# Patient Record
Sex: Male | Born: 1975 | Race: Black or African American | Hispanic: No | Marital: Married | Smoking: Never smoker
Health system: Southern US, Community
[De-identification: ages and names within clinical notes are randomized; demographics above are authoritative.]

---

## 2016-04-06 ENCOUNTER — Encounter (HOSPITAL_COMMUNITY): Payer: Self-pay

## 2016-04-06 ENCOUNTER — Ambulatory Visit (HOSPITAL_COMMUNITY)
Admission: EM | Admit: 2016-04-06 | Discharge: 2016-04-06 | Disposition: A | Discharge status: Home / Self Care | Payer: Self-pay | Attending: Emergency Medicine | Admitting: Emergency Medicine

## 2016-04-06 DIAGNOSIS — J111 Influenza due to unidentified influenza virus with other respiratory manifestations: Secondary | ICD-10-CM

## 2016-04-06 MED ORDER — IBUPROFEN 800 MG PO TABS: 800.0000 mg | ORAL_TABLET | Freq: Three times a day (TID) | ORAL | 0 refills | Status: DC

## 2016-04-06 MED ORDER — AEROCHAMBER PLUS MISC: 2 refills | Status: DC

## 2016-04-06 MED ORDER — ALBUTEROL SULFATE HFA 108 (90 BASE) MCG/ACT IN AERS: 1.0000 | INHALATION_SPRAY | Freq: Four times a day (QID) | RESPIRATORY_TRACT | 0 refills | Status: DC | PRN

## 2016-04-06 MED ORDER — DOXYCYCLINE HYCLATE 100 MG PO CAPS: 100.0000 mg | ORAL_CAPSULE | Freq: Two times a day (BID) | ORAL | 0 refills | Status: DC

## 2016-04-06 MED ORDER — GUAIFENESIN-CODEINE 100-10 MG/5ML PO SYRP: 10.0000 mL | ORAL_SOLUTION | Freq: Four times a day (QID) | ORAL | 0 refills | Status: DC | PRN

## 2016-04-06 NOTE — Discharge Instructions (Signed)
Cheratussin, albuterol with a spacer. Do this one to 2 puffs every 4-6 hours a needed for coughing, wheezing, shortness of breath. ibuprofen 800 mg to take with 1 g of Tylenol 3 times a day. Push fluids. I think that you either have a sinus infection or pneumonia from from the flu. Follow-up with a primary care physician of her choice. She was blow  Below is a list of primary care practices who are taking new patients for you to follow-up with. [facility] [address] [place], [place] [address] [phone]  [facility] [phone] [address] [place] [address]  [facility]: [address] [place] [id number]  [phone]  [facility]: [address] [place] [id number]   [phone]  [facility]: [address] [place] [address]  [phone]  [facility] : [address] [place] [address] [phone]  [facility]: [address] [place] [phone]  [facility]: [address] [place] [id number] [phone]  Dr. Oneal GroutMahima Pandey [address] [facility] [place] [id number]  [phone]  Dr. Jackie PlumGeorge Osei-Bonsu, [facility]. [address] [place], [place] [address]  [phone]

## 2016-04-06 NOTE — ED Triage Notes (Signed)
Pt said he thinks he has the flu. Fever of 101 a few days ago, body aches, cold chills and sweating. Cough. Was taking Theraflu .

## 2016-04-06 NOTE — ED Provider Notes (Signed)
HPI  SUBJECTIVE:  Louis Bush is a 41 y.o. male who presents with flulike symptoms for the past week. He reports nasal congestion, rhinorrhea, bodyaches, headaches, fevers Tmax 101. Reports ear fullness, but no ear pain. He reports a sore throat, but this has resolved. He reports purulent nasal drainage and sinus pain/pressure. He has a cough productive of the same material as his nasal congestion with some wheezing. No dental pain, facial swelling. No chest pain, short of breath, abdominal pain, urinary complaints. No rash, neck stiffness, photophobia. No skin infection. He drink there are flu once with improvement in symptoms. He has not taken any medicine in a week. There are no aggravating factors. He states that he is able to sleep at night. No double sickening. He reports 4 episodes of watery, nonbloody diarrhea starting today. Normally stools once daily. His past medical history of anemia. No history of asthma, emphysema, COPD. He is not a smoker. No history of diabetes, hypertension, pneumonias, UTI, immunocompromise or cancer. PMD: None.    History reviewed. No pertinent past medical history.  History reviewed. No pertinent surgical history.  No family history on file.  Social History  Substance Use Topics  . Smoking status: Never Smoker  . Smokeless tobacco: Never Used  . Alcohol use 3.6 oz/week    6 Cans of beer per week    No current facility-administered medications for this encounter.   Current Outpatient Prescriptions:  .  albuterol (PROVENTIL HFA;VENTOLIN HFA) 108 (90 Base) MCG/ACT inhaler, Inhale 1-2 puffs into the lungs every 6 (six) hours as needed for wheezing or shortness of breath., Disp: 1 Inhaler, Rfl: 0 .  doxycycline (VIBRAMYCIN) 100 MG capsule, Take 1 capsule (100 mg total) by mouth 2 (two) times daily. X 10 days, Disp: 20 capsule, Rfl: 0 .  guaiFENesin-codeine (CHERATUSSIN AC) 100-10 MG/5ML syrup, Take 10 mLs by mouth 4 (four) times daily as needed for cough.,  Disp: 120 mL, Rfl: 0 .  ibuprofen (ADVIL,MOTRIN) 800 MG tablet, Take 1 tablet (800 mg total) by mouth 3 (three) times daily., Disp: 30 tablet, Rfl: 0 .  Spacer/Aero-Holding Chambers (AEROCHAMBER PLUS) inhaler, Use as instructed, Disp: 1 each, Rfl: 2  No Known Allergies   ROS  As noted in HPI.   Physical Exam  BP 129/86 (BP Location: Left Arm)   Pulse 98   Temp 100.5 F (38.1 C) (Oral)   Resp 20   SpO2 97%   Constitutional: Well developed, well nourished, no acute distress Eyes: PERRL, EOMI, conjunctiva normal bilaterally HENT: Normocephalic, atraumatic,mucus membranes moist. Mild nasal congestion, normal turbinates. Positive mild maxillary sinus tenderness. Oropharynx normal. Neck: No cervical lymphadenopathy, no meningismus Respiratory: Clear to auscultation bilaterally, no rales, no wheezing, no rhonchi Cardiovascular: Normal rate and rhythm, no murmurs, no gallops, no rubs GI: Soft, nondistended, normal bowel sounds, nontender, no rebound, no guarding Back: no CVAT skin: No rash, skin intact Musculoskeletal: No edema, no tenderness, no deformities Neurologic: Alert & oriented x 3, CN II-XII grossly intact, no motor deficits, sensation grossly intact Psychiatric: Speech and behavior appropriate   ED Course   Medications - No data to display  No orders of the defined types were placed in this encounter.  No results found for this or any previous visit (from the past 24 hour(s)). No results found.  ED Clinical Impression  Influenza  ED Assessment/Plan  Patient febrile, but has not taken antipyretic in the past 6-8 hours. He is satting well room air. He does have some mild sinus  tenderness, but has no real nasal congestion and postnasal drip. Plan to send home with doxycycline x 10 days as I suspect that he has a secondary sinusitis or pneumonia from the flu that he had earlier this week. Does not appear to be meningitis, otitis, UTI or other intra-abdominal  process.We'll also give him some Cheratussin, albuterol with a spacer, ibuprofen 800 mg for him to take with 1 g of Tylenol 3 times a day. Providing primary care referral. Deferring chest x-ray today as it would not change management. We'll consider doing a chest x-ray see does not get better with the doxycycline.   Discussed  MDM, plan and followup with patient. Discussed sn/sx that should prompt return to the ED. Patient agrees with plan.   Meds ordered this encounter  Medications  . doxycycline (VIBRAMYCIN) 100 MG capsule    Sig: Take 1 capsule (100 mg total) by mouth 2 (two) times daily. X 10 days    Dispense:  20 capsule    Refill:  0  . ibuprofen (ADVIL,MOTRIN) 800 MG tablet    Sig: Take 1 tablet (800 mg total) by mouth 3 (three) times daily.    Dispense:  30 tablet    Refill:  0  . albuterol (PROVENTIL HFA;VENTOLIN HFA) 108 (90 Base) MCG/ACT inhaler    Sig: Inhale 1-2 puffs into the lungs every 6 (six) hours as needed for wheezing or shortness of breath.    Dispense:  1 Inhaler    Refill:  0  . Spacer/Aero-Holding Chambers (AEROCHAMBER PLUS) inhaler    Sig: Use as instructed    Dispense:  1 each    Refill:  2  . guaiFENesin-codeine (CHERATUSSIN AC) 100-10 MG/5ML syrup    Sig: Take 10 mLs by mouth 4 (four) times daily as needed for cough.    Dispense:  120 mL    Refill:  0    *This clinic note was created using Scientist, clinical (histocompatibility and immunogenetics)Dragon dictation software. Therefore, there may be occasional mistakes despite careful proofreading.  ?   Domenick GongAshley Patrick Salemi, MD 04/06/16 2138

## 2016-12-01 ENCOUNTER — Ambulatory Visit (HOSPITAL_COMMUNITY)
Admission: EM | Admit: 2016-12-01 | Discharge: 2016-12-01 | Disposition: A | Discharge status: Home / Self Care | Payer: Self-pay | Attending: Family Medicine | Admitting: Family Medicine

## 2016-12-01 ENCOUNTER — Encounter (HOSPITAL_COMMUNITY): Payer: Self-pay | Admitting: *Deleted

## 2016-12-01 DIAGNOSIS — T148XXA Other injury of unspecified body region, initial encounter: Secondary | ICD-10-CM

## 2016-12-01 LAB — POCT URINALYSIS DIP (DEVICE)
Bilirubin Urine: NEGATIVE
Glucose, UA: NEGATIVE mg/dL
Hgb urine dipstick: NEGATIVE
KETONES UR: NEGATIVE mg/dL
Leukocytes, UA: NEGATIVE
Nitrite: NEGATIVE
PH: 5.5 (ref 5.0–8.0)
PROTEIN: NEGATIVE mg/dL
Specific Gravity, Urine: >=1.03 (ref 1.005–1.030)
Urobilinogen, UA: 0.2 mg/dL (ref 0.0–1.0)

## 2016-12-01 MED ORDER — IBUPROFEN 800 MG PO TABS: 800.0000 mg | ORAL_TABLET | Freq: Three times a day (TID) | ORAL | 0 refills | Status: DC

## 2016-12-01 MED ORDER — CYCLOBENZAPRINE HCL 10 MG PO TABS: 10.0000 mg | ORAL_TABLET | Freq: Three times a day (TID) | ORAL | 0 refills | Status: DC | PRN

## 2016-12-01 NOTE — ED Triage Notes (Addendum)
C/O right low back pain x couple wks intermittently, much worse today.  States he feels like his urine looked different today.

## 2016-12-01 NOTE — ED Provider Notes (Addendum)
[facility]    CSN: [id number] Arrival date & time: 12/01/16  1836     History   Chief Complaint Chief Complaint  Patient presents with  . Back Pain    HPI Louis Bush is a 41 y.o. male.   Presents today for right low back pain for several week but noticed to be worse today. Pain radiates to the mid low back. Pain is sharp. Pain is constant. No dysuria but noticed possible blood in urine today; states the urine looked funny. Is sexually active with 1 partner. Denies hx of kidney stone. Patient have not tried anything for the pain.       History reviewed. No pertinent past medical history.  There are no active problems to display for this patient.   History reviewed. No pertinent surgical history.     Home Medications    Prior to Admission medications   Medication Sig Start Date End Date Taking? Authorizing Provider  albuterol (PROVENTIL HFA;VENTOLIN HFA) 108 (90 Base) MCG/ACT inhaler Inhale 1-2 puffs into the lungs every 6 (six) hours as needed for wheezing or shortness of breath. 04/06/16   Domenick Gong, MD  cyclobenzaprine (FLEXERIL) 10 MG tablet Take 1 tablet (10 mg total) by mouth 3 (three) times daily as needed for muscle spasms. 12/01/16   Lucia Estelle, NP  doxycycline (VIBRAMYCIN) 100 MG capsule Take 1 capsule (100 mg total) by mouth 2 (two) times daily. X 10 days 04/06/16   Domenick Gong, MD  guaiFENesin-codeine [facility]) 100-10 MG/5ML syrup Take 10 mLs by mouth 4 (four) times daily as needed for cough. 04/06/16   Domenick Gong, MD  ibuprofen (ADVIL,MOTRIN) 800 MG tablet Take 1 tablet (800 mg total) by mouth 3 (three) times daily. 12/01/16   Lucia Estelle, NP  Spacer/Aero-Holding Chambers (AEROCHAMBER PLUS) inhaler Use as instructed 04/06/16   Domenick Gong, MD    Family History No family history on file.  Social History Social History  Substance Use Topics  . Smoking status: Never Smoker  . Smokeless tobacco: Never Used  . Alcohol  use Yes     Comment: occasionally     Allergies   Bee venom   Review of Systems Review of Systems  Constitutional:       See HPI     Physical Exam Triage Vital Signs ED Triage Vitals  Enc Vitals Group     BP 12/01/16 1955 135/88     Pulse Rate 12/01/16 1955 72     Resp 12/01/16 1955 16     Temp 12/01/16 1955 98 F (36.7 C)     Temp Source 12/01/16 1955 Oral     SpO2 12/01/16 1955 99 %     Weight --      Height --      Head Circumference --      Peak Flow --      Pain Score 12/01/16 1956 8     Pain Loc --      Pain Edu? --      Excl. in GC? --    No data found.   Updated Vital Signs BP 135/88   Pulse 72   Temp 98 F (36.7 C) (Oral)   Resp 16   SpO2 99%   Visual Acuity Right Eye Distance:   Left Eye Distance:   Bilateral Distance:    Right Eye Near:   Left Eye Near:    Bilateral Near:     Physical Exam  Constitutional: He is oriented to  person, place, and time. He appears well-developed and well-nourished.  Cardiovascular: Normal rate, regular rhythm and normal heart sounds.   No murmur heard. Pulmonary/Chest: Effort normal and breath sounds normal. He has no wheezes.  Abdominal: Soft. Bowel sounds are normal. There is no tenderness.  Musculoskeletal:       Back:  Tender to palpate over right low back and mid low back. Ambulatory, gait normal. Has full spine ROM.   Neurological: He is alert and oriented to person, place, and time.  Skin: Skin is warm and dry.  Nursing note and vitals reviewed.    UC Treatments / Results  Labs (all labs ordered are listed, but only abnormal results are displayed) Labs Reviewed  POCT URINALYSIS DIP (DEVICE)    EKG  EKG Interpretation None       Radiology No results found.  Procedures Procedures (including critical care time)  Medications Ordered in UC Medications - No data to display   Initial Impression / Assessment and Plan / UC Course  I have reviewed the triage vital signs and the  nursing notes.  Pertinent labs & imaging results that were available during my care of the patient were reviewed by me and considered in my medical decision making (see chart for details).    Final Clinical Impressions(s) / UC Diagnoses   Final diagnoses:  Muscle strain   UA unremarkable. Kidney stone very low in differential. Clinical presentation most consistent with muscle strain. Will tx with NSAID and muscle relaxer. RX send in.  Apply heat to the area.  Advised to f/u with PCP for no improvement.   New Prescriptions New Prescriptions   CYCLOBENZAPRINE (FLEXERIL) 10 MG TABLET    Take 1 tablet (10 mg total) by mouth 3 (three) times daily as needed for muscle spasms.   IBUPROFEN (ADVIL,MOTRIN) 800 MG TABLET    Take 1 tablet (800 mg total) by mouth 3 (three) times daily.    Controlled Substance Prescriptions [place] Controlled Substance Registry consulted? Yes     Lucia EstelleZheng, Gracey Tolle, NP 12/01/16 2016

## 2017-04-13 ENCOUNTER — Ambulatory Visit (HOSPITAL_COMMUNITY)
Admission: EM | Admit: 2017-04-13 | Discharge: 2017-04-13 | Disposition: A | Discharge status: Home / Self Care | Payer: Self-pay | Attending: Family Medicine | Admitting: Family Medicine

## 2017-04-13 ENCOUNTER — Encounter (HOSPITAL_COMMUNITY): Payer: Self-pay | Admitting: *Deleted

## 2017-04-13 ENCOUNTER — Other Ambulatory Visit: Payer: Self-pay

## 2017-04-13 DIAGNOSIS — M79662 Pain in left lower leg: Secondary | ICD-10-CM

## 2017-04-13 DIAGNOSIS — S86112A Strain of other muscle(s) and tendon(s) of posterior muscle group at lower leg level, left leg, initial encounter: Secondary | ICD-10-CM

## 2017-04-13 MED ORDER — HYDROCODONE-ACETAMINOPHEN 5-325 MG PO TABS: 1.0000 | ORAL_TABLET | Freq: Four times a day (QID) | ORAL | 0 refills | Status: DC | PRN

## 2017-04-13 MED ORDER — DICLOFENAC SODIUM 75 MG PO TBEC: 75.0000 mg | DELAYED_RELEASE_TABLET | Freq: Two times a day (BID) | ORAL | 0 refills | Status: DC

## 2017-04-13 NOTE — ED Notes (Addendum)
The crutches we have do not fit the patient as he is 6'7 and over 300 lbs. Paged ortho tech and they state they also do not have any. Per Dr. Tracie HarrierHagler we can just let him go to the pharmacy and ask if they can special order some. Also he should try to be non weight bearing at home over the next few days as per his work note.

## 2017-04-13 NOTE — Discharge Instructions (Addendum)
Use crutches to avoid putting weight on your left leg. You will need to follow up in one week with an orthopaedist. You may ask your pharmacy if they have a calf compression sleeve to wear.  Be aware, pain medications may cause drowsiness. Please do not drive, operate heavy machinery or make important decisions while on this medication, it can cloud your judgement.

## 2017-04-13 NOTE — ED Triage Notes (Signed)
Per pt his left shin is swollen, per pt this started yesterday,

## 2017-04-17 NOTE — ED Provider Notes (Signed)
[facility]   [id number] 04/13/17 Arrival Time: 1557  ASSESSMENT & PLAN:  1. Gastrocnemius tear, left, initial encounter    Meds ordered this encounter  Medications  . diclofenac (VOLTAREN) 75 MG EC tablet    Sig: Take 1 tablet (75 mg total) by mouth 2 (two) times daily.    Dispense:  14 tablet    Refill:  0  . HYDROcodone-acetaminophen (NORCO/VICODIN) 5-325 MG tablet    Sig: Take 1 tablet by mouth every 6 (six) hours as needed for moderate pain or severe pain.    Dispense:  8 tablet    Refill:  0   We do not have crutches for his height. He will acquire from medical supply. May be WBAT but crutches may help with discomfort.  Recommend that he schedule prompt f/u with an orthopaedist; information given. May f/u here as neededn  Reviewed expectations re: course of current medical issues. Questions answered. Outlined signs and symptoms indicating need for more acute intervention. Patient verbalized understanding. After Visit Summary given.  SUBJECTIVE: History from: patient. Louis Bush is a 42 y.o. male who reports:  Pain/Discomfort of: left calf Onset abrupt, yesterday Frequency: intermittent discomfort Injury/trama: reports shifting weight on legs very quickly because he almost slipped yesterday; immediate "kind of a popping sound" with discomfort over lower calf; no direct trauma to calf Describes as: localized aching without radiation Severity: mild Progression: stable Relieved by: not bearing weight Worsened by: movement and weight bearing Associated symptoms: mild swelling Extremity sensation changes or weakness: none Self treatment: has not tried OTCs for relief of pain  History of similar: no  ROS: As per HPI.   OBJECTIVE:  Vitals:   04/13/17 1732  BP: 134/74  Pulse: 75  Temp: 98.7 F (37.1 C)  TempSrc: Oral  SpO2: 100%    General appearance: alert; no distress Extremities: no cyanosis or edema; symmetrical with no gross deformities;  localized tenderness over his left calf with mild swelling and no bruising; no bunching of muscle but he is quite tender over the distal gastrocnemius ROM: normal CV: normal extremity capillary refill Skin: warm and dry Neurologic: normal gait; normal symmetric reflexes in all extremities; normal sensation in all extremities Psychological: alert and cooperative; normal mood and affect  Allergies  Allergen Reactions  . Bee Venom      Social History   Socioeconomic History  . Marital status: Married    Spouse name: Not on file  . Number of children: Not on file  . Years of education: Not on file  . Highest education level: Not on file  Social Needs  . Financial resource strain: Not on file  . Food insecurity - worry: Not on file  . Food insecurity - inability: Not on file  . Transportation needs - medical: Not on file  . Transportation needs - non-medical: Not on file  Occupational History  . Not on file  Tobacco Use  . Smoking status: Never Smoker  . Smokeless tobacco: Never Used  Substance and Sexual Activity  . Alcohol use: Yes    Comment: occasionally  . Drug use: No  . Sexual activity: Not on file  Other Topics Concern  . Not on file  Social History Narrative  . Not on file   Family History  Problem Relation Age of Onset  . Prostate cancer Father   . Heart attack Father        had open heart Surgery    History reviewed. No pertinent surgical history.  Mardella LaymanHagler, Hanson Medeiros, MD 04/17/17 [phone]

## 2017-11-05 ENCOUNTER — Emergency Department (HOSPITAL_COMMUNITY)
Admission: EM | Admit: 2017-11-05 | Discharge: 2017-11-06 | Disposition: A | Discharge status: Home / Self Care | Payer: Self-pay | Attending: Emergency Medicine | Admitting: Emergency Medicine

## 2017-11-05 ENCOUNTER — Emergency Department (HOSPITAL_COMMUNITY): Payer: Self-pay

## 2017-11-05 ENCOUNTER — Other Ambulatory Visit: Payer: Self-pay

## 2017-11-05 ENCOUNTER — Encounter (HOSPITAL_COMMUNITY): Payer: Self-pay | Admitting: Emergency Medicine

## 2017-11-05 DIAGNOSIS — R079 Chest pain, unspecified: Secondary | ICD-10-CM

## 2017-11-05 DIAGNOSIS — Z79899 Other long term (current) drug therapy: Secondary | ICD-10-CM | POA: Insufficient documentation

## 2017-11-05 DIAGNOSIS — R0789 Other chest pain: Secondary | ICD-10-CM | POA: Insufficient documentation

## 2017-11-05 LAB — BASIC METABOLIC PANEL
ANION GAP: 11 (ref 5–15)
BUN: 13 mg/dL (ref 6–20)
CALCIUM: 9.7 mg/dL (ref 8.9–10.3)
CO2: 22 mmol/L (ref 22–32)
Chloride: 106 mmol/L (ref 98–111)
Creatinine, Ser: 1.34 mg/dL — ABNORMAL HIGH (ref 0.61–1.24)
Glucose, Bld: 107 mg/dL — ABNORMAL HIGH (ref 70–99)
POTASSIUM: 4.2 mmol/L (ref 3.5–5.1)
Sodium: 139 mmol/L (ref 135–145)

## 2017-11-05 LAB — CBC
HEMATOCRIT: 41.4 % (ref 39.0–52.0)
HEMOGLOBIN: 13.4 g/dL (ref 13.0–17.0)
MCH: 25.4 pg — ABNORMAL LOW (ref 26.0–34.0)
MCHC: 32.4 g/dL (ref 30.0–36.0)
MCV: 78.6 fL (ref 78.0–100.0)
Platelets: 262 10*3/uL (ref 150–400)
RBC: 5.27 MIL/uL (ref 4.22–5.81)
RDW: 18 % — ABNORMAL HIGH (ref 11.5–15.5)
WBC: 8.8 10*3/uL (ref 4.0–10.5)

## 2017-11-05 LAB — I-STAT TROPONIN, ED: TROPONIN I, POC: 0.01 ng/mL (ref 0.00–0.08)

## 2017-11-05 NOTE — ED Triage Notes (Signed)
Pt st's he started having left sided sharp chest pain tonight while at rest.  Pain increases with deep breathing.

## 2017-11-06 LAB — I-STAT TROPONIN, ED: TROPONIN I, POC: 0.01 ng/mL (ref 0.00–0.08)

## 2017-11-06 NOTE — Discharge Instructions (Addendum)
Follow-up with cardiology to discuss a possible stress test.  [facility] information [facility] has been provided in this discharge summary for you to call and make these arrangements.  Return to the emergency department if your symptoms significantly worsen or change.

## 2017-11-06 NOTE — ED Provider Notes (Signed)
[facility]   CSN: [id number] Arrival date & time: 11/05/17  2131     History   Chief Complaint Chief Complaint  Patient presents with  . Chest Pain    HPI Viann Shoveroy Sermersheim is a 42 y.o. male.  Patient is a 42 year old male with past medical history of asthma.  He presents today for evaluation of chest discomfort.  This started approximately 9:00 this evening while he was laying in bed watching TV.  He reports some arm numbness and feeling sweaty, but denied shortness of breath or nausea.  He denies any recent exertional symptoms.  He denies any prior cardiac history.  Cardiac risk factors include obesity and family history with his father having heart issues in his 3350s.  The history is provided by the patient.  Chest Pain   This is a new problem. The current episode started 1 to 2 hours ago. The problem occurs constantly. The problem has been gradually improving. The pain is present in the lateral region. The pain is moderate. The quality of the pain is described as pressure-like. The pain radiates to the left arm. Pertinent negatives include no diaphoresis, no nausea and no shortness of breath. He has tried nothing for the symptoms. Risk factors include obesity.    History reviewed. No pertinent past medical history.  There are no active problems to display for this patient.   History reviewed. No pertinent surgical history.      Home Medications    Prior to Admission medications   Medication Sig Start Date End Date Taking? Authorizing Provider  albuterol (PROVENTIL HFA;VENTOLIN HFA) 108 (90 Base) MCG/ACT inhaler Inhale 1-2 puffs into the lungs every 6 (six) hours as needed for wheezing or shortness of breath. 04/06/16   Domenick GongMortenson, Ashley, MD  cyclobenzaprine (FLEXERIL) 10 MG tablet Take 1 tablet (10 mg total) by mouth 3 (three) times daily as needed for muscle spasms. 12/01/16   Lucia EstelleZheng, Feng, NP  diclofenac (VOLTAREN) 75 MG EC  tablet Take 1 tablet (75 mg total) by mouth 2 (two) times daily. 04/13/17   Mardella LaymanHagler, Brian, MD  doxycycline (VIBRAMYCIN) 100 MG capsule Take 1 capsule (100 mg total) by mouth 2 (two) times daily. X 10 days 04/06/16   Domenick GongMortenson, Ashley, MD  guaiFENesin-codeine [facility](CHERATUSSIN AC) 100-10 MG/5ML syrup Take 10 mLs by mouth 4 (four) times daily as needed for cough. 04/06/16   Domenick GongMortenson, Ashley, MD  HYDROcodone-acetaminophen (NORCO/VICODIN) 5-325 MG tablet Take 1 tablet by mouth every 6 (six) hours as needed for moderate pain or severe pain. 04/13/17   Mardella LaymanHagler, Brian, MD  ibuprofen (ADVIL,MOTRIN) 800 MG tablet Take 1 tablet (800 mg total) by mouth 3 (three) times daily. 12/01/16   Lucia EstelleZheng, Feng, NP  Spacer/Aero-Holding Chambers (AEROCHAMBER PLUS) inhaler Use as instructed 04/06/16   Domenick GongMortenson, Ashley, MD    Family History Family History  Problem Relation Age of Onset  . Prostate cancer Father   . Heart attack Father        had open heart Surgery     Social History Social History   Tobacco Use  . Smoking status: Never Smoker  . Smokeless tobacco: Never Used  Substance Use Topics  . Alcohol use: Yes    Comment: occasionally  . Drug use: No    Types: Marijuana     Allergies   Bee venom   Review of Systems Review of Systems  Constitutional: Negative for diaphoresis.  Respiratory: Negative for shortness of breath.   Cardiovascular:  Positive for chest pain.  Gastrointestinal: Negative for nausea.  All other systems reviewed and are negative.    Physical Exam Updated Vital Signs BP 117/81   Pulse (!) 49   Temp 98.4 F (36.9 C) (Oral)   Resp 19   Ht 6' 6.5" (1.994 m)   Wt (!) 154.2 kg   SpO2 95%   BMI 38.79 kg/m   Physical Exam  Constitutional: He is oriented to person, place, and time. He appears well-developed and well-nourished. No distress.  HENT:  Head: Normocephalic and atraumatic.  Mouth/Throat: Oropharynx is clear and moist.  Neck: Normal range of motion. Neck supple.    Cardiovascular: Normal rate and regular rhythm. Exam reveals no friction rub.  No murmur heard. Pulmonary/Chest: Effort normal and breath sounds normal. No respiratory distress. He has no wheezes. He has no rales.  Abdominal: Soft. Bowel sounds are normal. He exhibits no distension. There is no tenderness.  Musculoskeletal: Normal range of motion. He exhibits no edema.       Right lower leg: Normal. He exhibits no tenderness and no edema.       Left lower leg: Normal. He exhibits no tenderness and no edema.  Neurological: He is alert and oriented to person, place, and time. Coordination normal.  Skin: Skin is warm and dry. He is not diaphoretic.  Nursing note and vitals reviewed.    ED Treatments / Results  Labs (all labs ordered are listed, but only abnormal results are displayed) Labs Reviewed  BASIC METABOLIC PANEL - Abnormal; Notable for the following components:      Result Value   Glucose, Bld 107 (*)    Creatinine, Ser 1.34 (*)    All other components within normal limits  CBC - Abnormal; Notable for the following components:   MCH 25.4 (*)    RDW 18.0 (*)    All other components within normal limits  I-STAT TROPONIN, ED  I-STAT TROPONIN, ED    EKG EKG Interpretation  Date/Time:  Tuesday November 05 2017 21:37:42 EDT Ventricular Rate:  78 PR Interval:  164 QRS Duration: 88 QT Interval:  376 QTC Calculation: 428 R Axis:   15 Text Interpretation:  Normal sinus rhythm Low voltage QRS Borderline ECG Confirmed by Geoffery LyonseLo, Blakeley Margraf ([id number]) on 11/06/2017 1:11:30 AM   Radiology Dg Chest 2 View  Result Date: 11/05/2017 CLINICAL DATA:  Left-sided chest pain EXAM: CHEST - 2 VIEW COMPARISON:  None. FINDINGS: The heart size and mediastinal contours are within normal limits. Both lungs are clear. The visualized skeletal structures are unremarkable. IMPRESSION: No active cardiopulmonary disease. Electronically Signed   By: Jasmine PangKim  Fujinaga M.D.   On: 11/05/2017 22:14     Procedures Procedures (including critical care time)  Medications Ordered in ED Medications - No data to display   Initial Impression / Assessment and Plan / ED Course  I have reviewed the triage vital signs and the nursing notes.  Pertinent labs & imaging results that were available during my care of the patient were reviewed by me and considered in my medical decision making (see chart for details).  Patient presents here with chest discomfort that started approximately 9 PM while watching TV.  His symptoms are somewhat atypical for cardiac pain in his work-up is unremarkable.  His EKG shows no acute process and troponin x2 is negative.  He is currently symptom-free.  I have discussed the disposition with the patient and wife at bedside.  Patient would like to be discharged and follow-up with  cardiology as an outpatient for possible stress testing.  He understands to return if he worsens.  Final Clinical Impressions(s) / ED Diagnoses   Final diagnoses:  None    ED Discharge Orders    None       Geoffery Lyons, MD 11/06/17 Earle Gell

## 2017-12-05 ENCOUNTER — Encounter (HOSPITAL_COMMUNITY): Payer: Self-pay | Admitting: Emergency Medicine

## 2017-12-05 ENCOUNTER — Emergency Department (HOSPITAL_COMMUNITY)
Admission: EM | Admit: 2017-12-05 | Discharge: 2017-12-05 | Disposition: A | Discharge status: Home / Self Care | Payer: Self-pay | Attending: Emergency Medicine | Admitting: Emergency Medicine

## 2017-12-05 DIAGNOSIS — M5430 Sciatica, unspecified side: Secondary | ICD-10-CM | POA: Insufficient documentation

## 2017-12-05 DIAGNOSIS — Z79899 Other long term (current) drug therapy: Secondary | ICD-10-CM | POA: Insufficient documentation

## 2017-12-05 MED ORDER — HYDROCODONE-ACETAMINOPHEN 5-325 MG PO TABS: 1.0000 | ORAL_TABLET | ORAL | 0 refills | Status: DC | PRN

## 2017-12-05 MED ORDER — DIAZEPAM 5 MG PO TABS: 5.0000 mg | ORAL_TABLET | Freq: Two times a day (BID) | ORAL | 0 refills | Status: DC

## 2017-12-05 MED ORDER — LORAZEPAM 2 MG/ML IJ SOLN
1.0000 mg | Freq: Once | INTRAMUSCULAR | Status: AC
  Administered 2017-12-05: 1 mg via INTRAVENOUS
  Filled 2017-12-05: qty 1

## 2017-12-05 MED ORDER — PREDNISONE 10 MG (21) PO TBPK: ORAL_TABLET | ORAL | 0 refills | Status: DC

## 2017-12-05 MED ORDER — DEXAMETHASONE SODIUM PHOSPHATE 10 MG/ML IJ SOLN
10.0000 mg | Freq: Once | INTRAMUSCULAR | Status: AC
  Administered 2017-12-05: 10 mg via INTRAVENOUS
  Filled 2017-12-05: qty 1

## 2017-12-05 NOTE — ED Provider Notes (Signed)
[facility]-EMERGENCY DEPT Provider Note   CSN: [id number] Arrival date & time: 12/05/17  0908     History   Chief Complaint Chief Complaint  Patient presents with  . Back Pain    HPI Louis Bush is a 42 y.o. male.  Pt presents to the ED today with right sided low back pain.  Pt said pain radiates to his right leg.  The pt said he had a little pain yesterday, but it was worse today.  He had a very difficult time getting out of bed and has a lot of spasm in his low back.  The pt called EMS who gave him 100 mcg fentanyl en route.  His pain has improved, but he still has the spasms.  No bowel or bladder problems.      History reviewed. No pertinent past medical history.  There are no active problems to display for this patient.   History reviewed. No pertinent surgical history.      Home Medications    Prior to Admission medications   Medication Sig Start Date End Date Taking? Authorizing Provider  albuterol (PROVENTIL HFA;VENTOLIN HFA) 108 (90 Base) MCG/ACT inhaler Inhale 1-2 puffs into the lungs every 6 (six) hours as needed for wheezing or shortness of breath. Patient not taking: Reported on 11/06/2017 04/06/16   Domenick Gong, MD  cyclobenzaprine (FLEXERIL) 10 MG tablet Take 1 tablet (10 mg total) by mouth 3 (three) times daily as needed for muscle spasms. Patient not taking: Reported on 11/06/2017 12/01/16   Lucia Estelle, NP  diazepam (VALIUM) 5 MG tablet Take 1 tablet (5 mg total) by mouth 2 (two) times daily. 12/05/17   Jacalyn Lefevre, MD  diclofenac (VOLTAREN) 75 MG EC tablet Take 1 tablet (75 mg total) by mouth 2 (two) times daily. Patient not taking: Reported on 11/06/2017 04/13/17   Mardella Layman, MD  doxycycline (VIBRAMYCIN) 100 MG capsule Take 1 capsule (100 mg total) by mouth 2 (two) times daily. X 10 days Patient not taking: Reported on 11/06/2017 04/06/16   Domenick Gong, MD  guaiFENesin-codeine [facility]) 100-10 MG/5ML syrup Take 10  mLs by mouth 4 (four) times daily as needed for cough. Patient not taking: Reported on 11/06/2017 04/06/16   Domenick Gong, MD  HYDROcodone-acetaminophen (NORCO/VICODIN) 5-325 MG tablet Take 1 tablet by mouth every 4 (four) hours as needed. 12/05/17   Jacalyn Lefevre, MD  ibuprofen (ADVIL,MOTRIN) 800 MG tablet Take 1 tablet (800 mg total) by mouth 3 (three) times daily. Patient not taking: Reported on 11/06/2017 12/01/16   Lucia Estelle, NP  predniSONE (STERAPRED UNI-PAK 21 TAB) 10 MG (21) TBPK tablet Take 6 tabs for 2 days, then 5 for 2 days, then 4 for 2 days, then 3 for 2 days, 2 for 2 days, then 1 tab for 2 days 12/05/17   Jacalyn Lefevre, MD  Spacer/Aero-Holding Chambers (AEROCHAMBER PLUS) inhaler Use as instructed 04/06/16   Domenick Gong, MD    Family History Family History  Problem Relation Age of Onset  . Prostate cancer Father   . Heart attack Father        had open heart Surgery     Social History Social History   Tobacco Use  . Smoking status: Never Smoker  . Smokeless tobacco: Never Used  Substance Use Topics  . Alcohol use: Yes    Comment: occasionally  . Drug use: No    Types: Marijuana     Allergies   Bee venom   Review of  Systems Review of Systems  Musculoskeletal: Positive for back pain.  All other systems reviewed and are negative.    Physical Exam Updated Vital Signs BP (!) 131/92 (BP Location: Right Arm)   Pulse (!) 59   Temp 98.2 F (36.8 C) (Oral)   Resp 18   SpO2 97%   Physical Exam  Constitutional: He is oriented to person, place, and time. He appears well-developed and well-nourished.  HENT:  Head: Normocephalic and atraumatic.  Right Ear: External ear normal.  Left Ear: External ear normal.  Nose: Nose normal.  Mouth/Throat: Oropharynx is clear and moist.  Eyes: Pupils are equal, round, and reactive to light. Conjunctivae and EOM are normal.  Neck: Normal range of motion. Neck supple.  Cardiovascular: Normal rate, regular rhythm,  normal heart sounds and intact distal pulses.  Pulmonary/Chest: Effort normal and breath sounds normal.  Abdominal: Soft. Bowel sounds are normal.  Musculoskeletal:       Arms: Neurological: He is alert and oriented to person, place, and time.  Skin: Skin is warm. Capillary refill takes less than 2 seconds.  Psychiatric: He has a normal mood and affect. His behavior is normal. Judgment and thought content normal.  Nursing note and vitals reviewed.    ED Treatments / Results  Labs (all labs ordered are listed, but only abnormal results are displayed) Labs Reviewed - No data to display  EKG None  Radiology No results found.  Procedures Procedures (including critical care time)  Medications Ordered in ED Medications  dexamethasone (DECADRON) injection 10 mg (10 mg Intravenous Given 12/05/17 1056)  LORazepam (ATIVAN) injection 1 mg (1 mg Intravenous Given 12/05/17 1056)     Initial Impression / Assessment and Plan / ED Course  I have reviewed the triage vital signs and the nursing notes.  Pertinent labs & imaging results that were available during my care of the patient were reviewed by me and considered in my medical decision making (see chart for details).     Pt is feeling better.  He is able to move and to walk.  He is instructed to return if worse and to f/u with pcp.  Final Clinical Impressions(s) / ED Diagnoses   Final diagnoses:  Acute sciatica    ED Discharge Orders         Ordered    predniSONE (STERAPRED UNI-PAK 21 TAB) 10 MG (21) TBPK tablet     12/05/17 1056    diazepam (VALIUM) 5 MG tablet  2 times daily     12/05/17 1056    HYDROcodone-acetaminophen (NORCO/VICODIN) 5-325 MG tablet  Every 4 hours PRN     12/05/17 1056           Jacalyn LefevreHaviland, Jelitza Manninen, MD 12/05/17 1057

## 2017-12-05 NOTE — ED Triage Notes (Signed)
Per [facility] pt reports that this morning while getting out of bed had back spasms. Had previous weight lifting injury years ago. IV-18g left AC fentanyl in route to help with pain. Pt is now sitting in wheelchair and comfortable until spasms flare up.

## 2017-12-05 NOTE — ED Triage Notes (Signed)
Pt reports back pain is lower that radiates down right leg.

## 2018-09-26 IMAGING — DX DG CHEST 2V
2 series · 2 of 2 positions shown · non-contrast
Comparison: None.

CLINICAL DATA: Left-sided chest pain

EXAM:
CHEST - 2 VIEW

[chest pa]
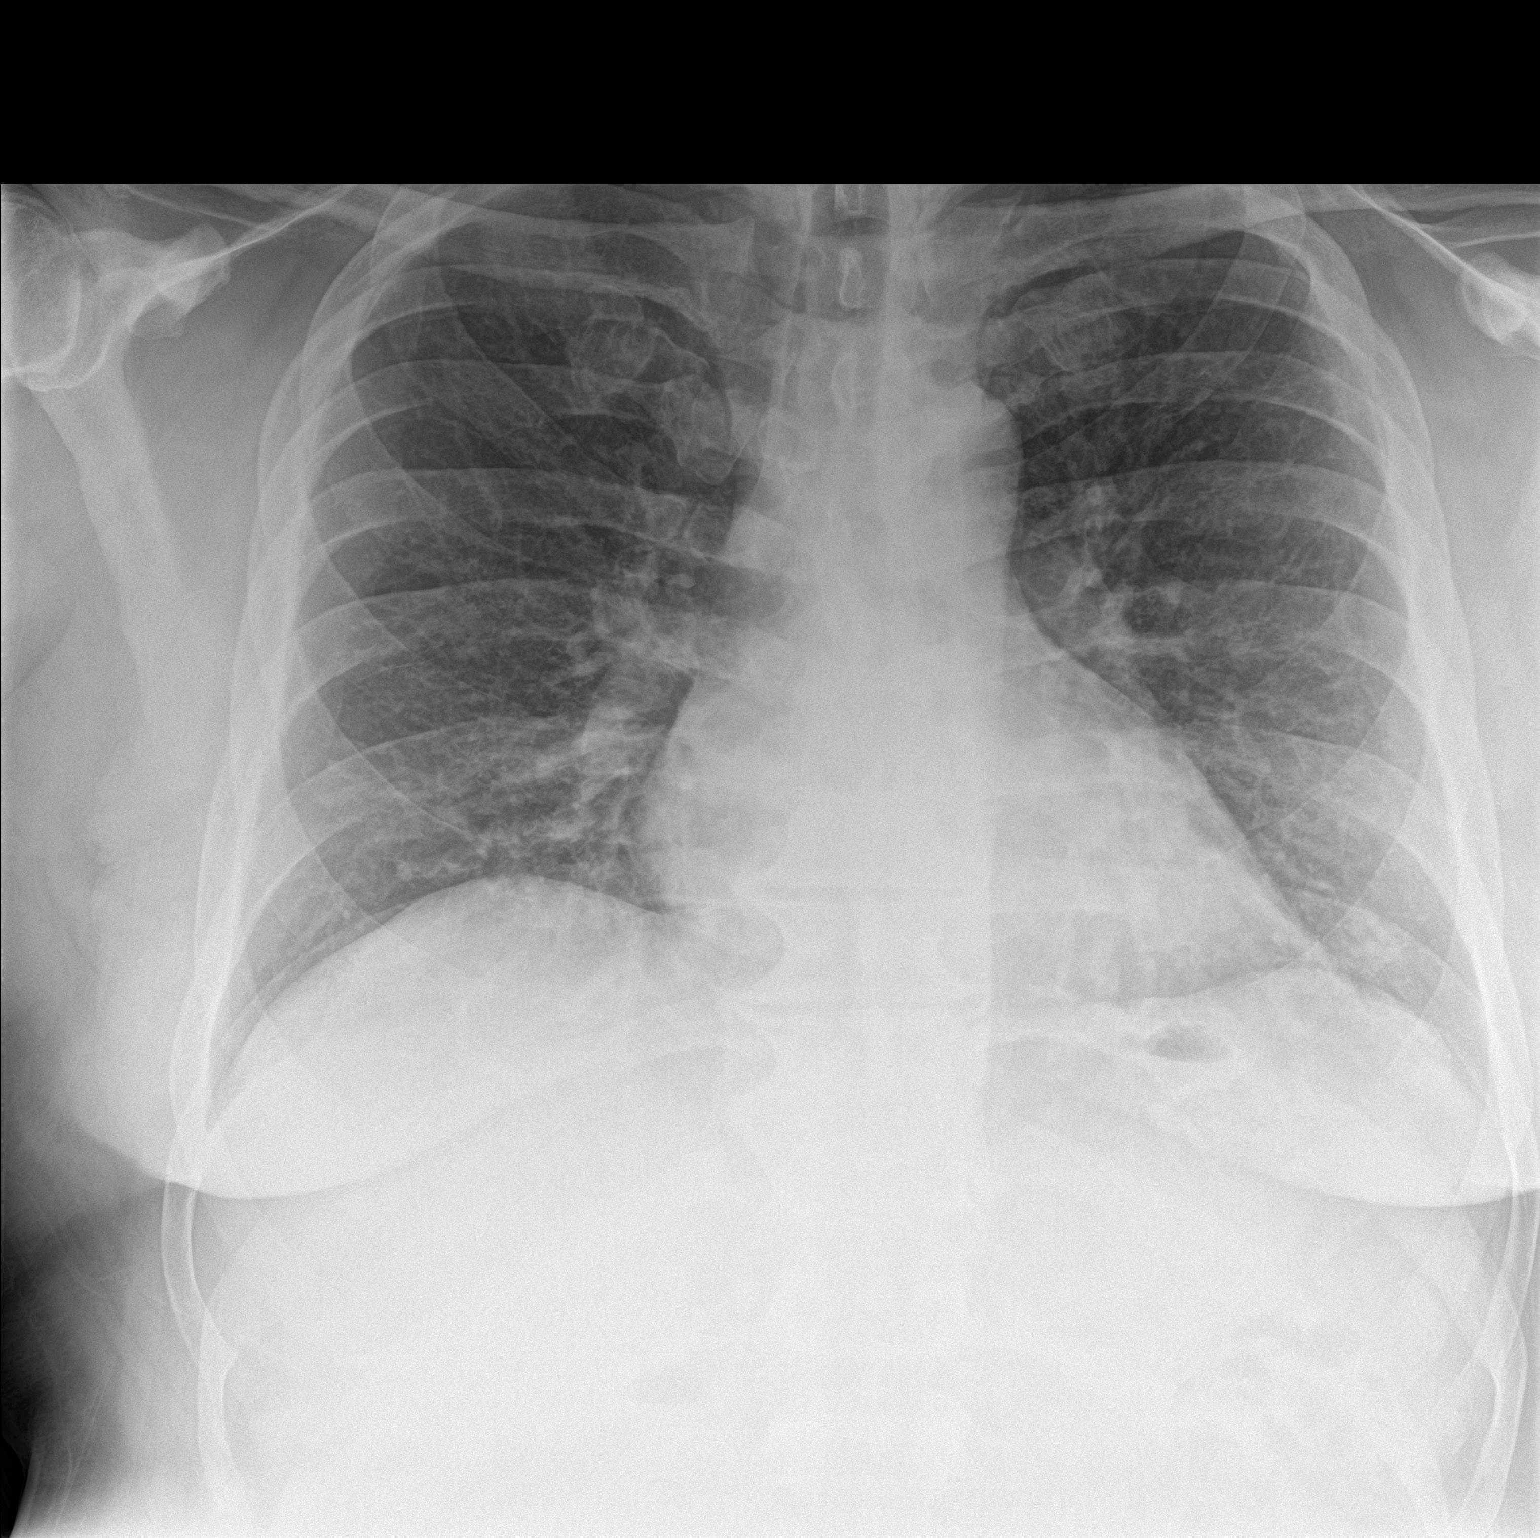

[chest lat]
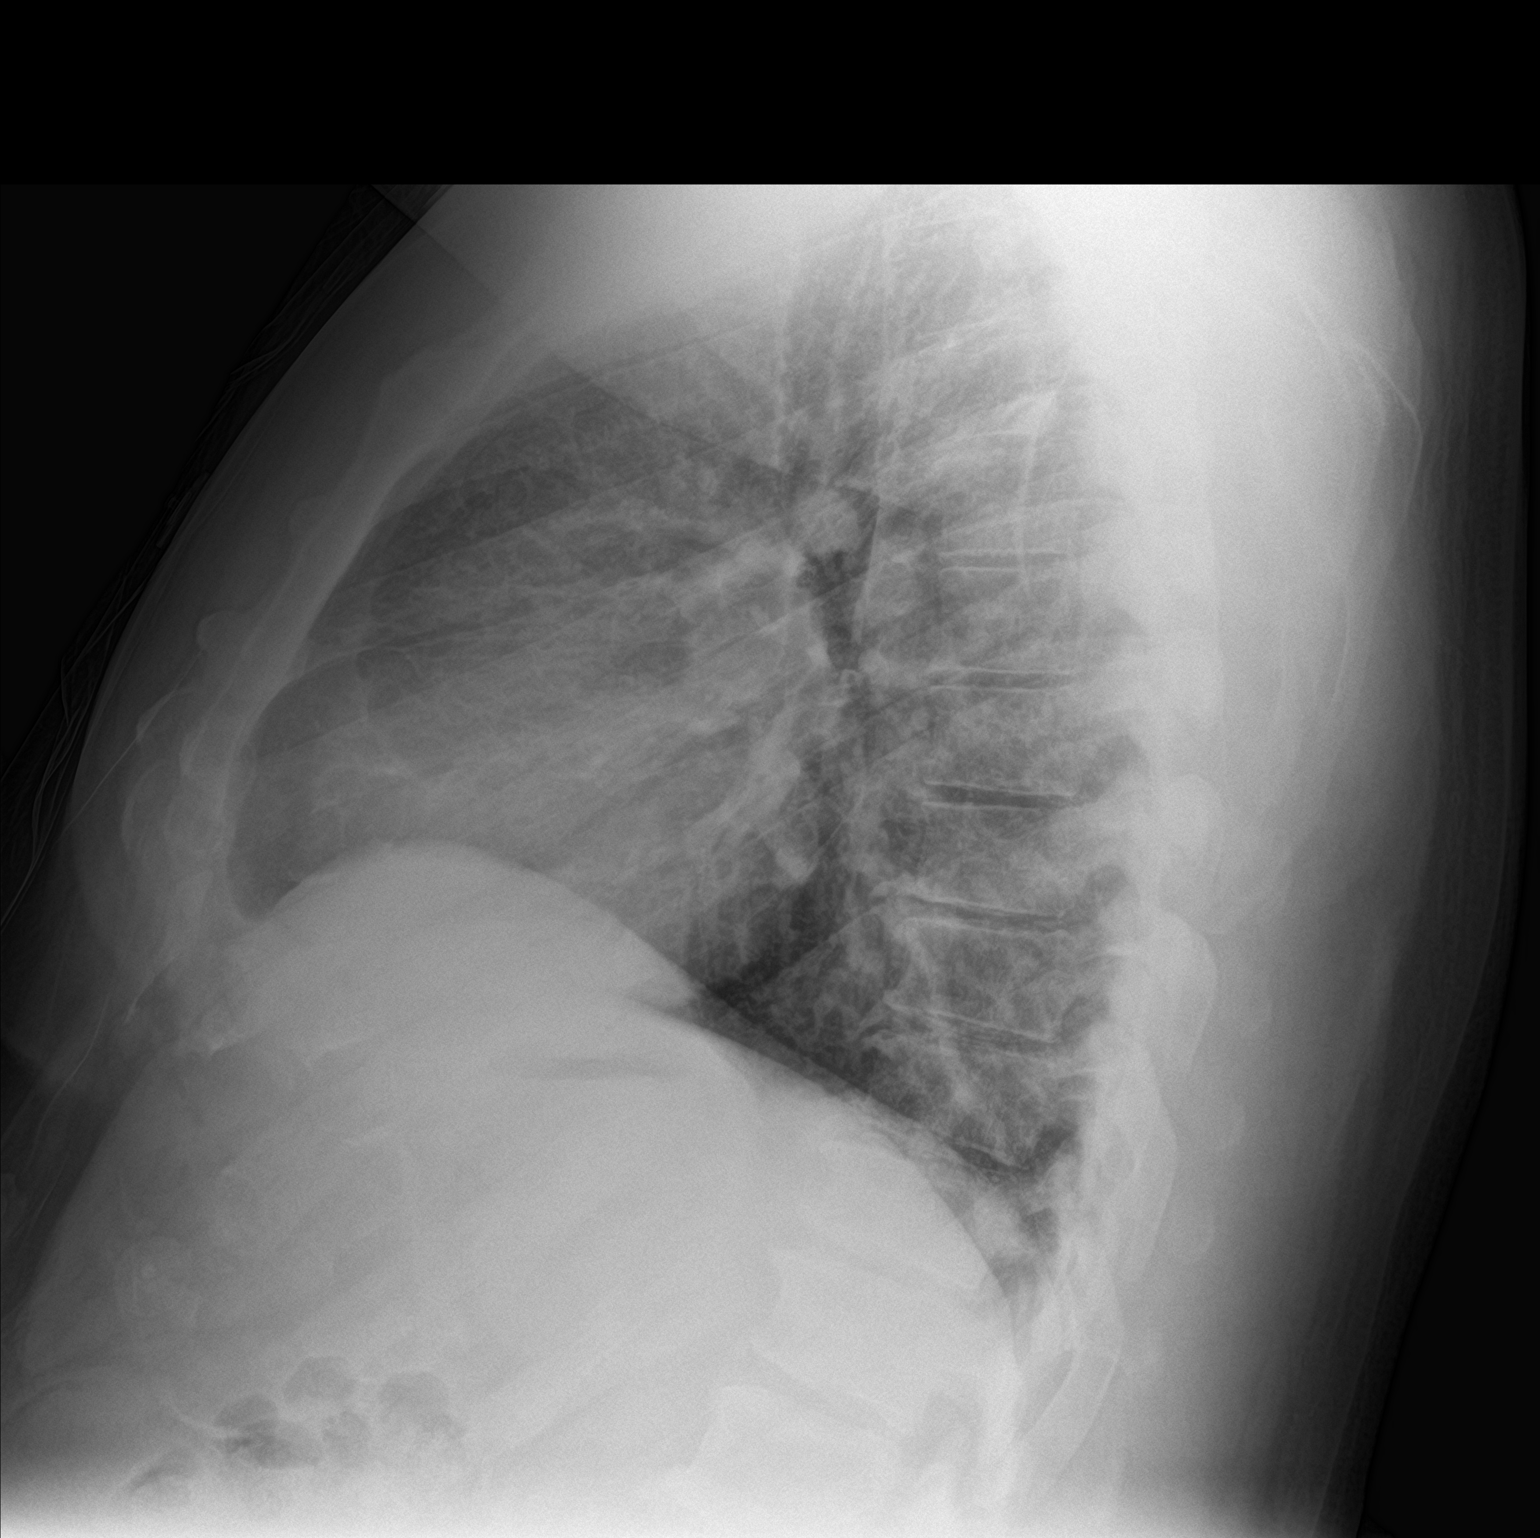

[2 of 2 positions shown; findings below may reference images not displayed]

FINDINGS: The heart size and mediastinal contours are within normal limits.
Both lungs are clear. The visualized skeletal structures are
unremarkable.
IMPRESSION: No active cardiopulmonary disease.

## 2021-10-09 ENCOUNTER — Encounter (HOSPITAL_BASED_OUTPATIENT_CLINIC_OR_DEPARTMENT_OTHER): Payer: Self-pay | Admitting: Emergency Medicine

## 2021-10-09 ENCOUNTER — Emergency Department (HOSPITAL_BASED_OUTPATIENT_CLINIC_OR_DEPARTMENT_OTHER)
Admission: EM | Admit: 2021-10-09 | Discharge: 2021-10-10 | Disposition: A | Discharge status: Home / Self Care | Payer: Self-pay | Attending: Emergency Medicine | Admitting: Emergency Medicine

## 2021-10-09 ENCOUNTER — Other Ambulatory Visit: Payer: Self-pay

## 2021-10-09 DIAGNOSIS — K529 Noninfective gastroenteritis and colitis, unspecified: Secondary | ICD-10-CM | POA: Insufficient documentation

## 2021-10-09 LAB — CBC WITH DIFFERENTIAL/PLATELET
Abs Immature Granulocytes: 0.02 10*3/uL (ref 0.00–0.07)
Basophils Absolute: 0.1 10*3/uL (ref 0.0–0.1)
Basophils Relative: 1 %
Eosinophils Absolute: 0.3 10*3/uL (ref 0.0–0.5)
Eosinophils Relative: 4 %
HCT: 38.9 % — ABNORMAL LOW (ref 39.0–52.0)
Hemoglobin: 13.1 g/dL (ref 13.0–17.0)
Immature Granulocytes: 0 %
Lymphocytes Relative: 30 %
Lymphs Abs: 2.5 10*3/uL (ref 0.7–4.0)
MCH: 27.3 pg (ref 26.0–34.0)
MCHC: 33.7 g/dL (ref 30.0–36.0)
MCV: 81 fL (ref 80.0–100.0)
Monocytes Absolute: 0.8 10*3/uL (ref 0.1–1.0)
Monocytes Relative: 10 %
Neutro Abs: 4.5 10*3/uL (ref 1.7–7.7)
Neutrophils Relative %: 55 %
Platelets: 248 10*3/uL (ref 150–400)
RBC: 4.8 MIL/uL (ref 4.22–5.81)
RDW: 16.2 % — ABNORMAL HIGH (ref 11.5–15.5)
WBC: 8.2 10*3/uL (ref 4.0–10.5)
nRBC: 0 % (ref 0.0–0.2)

## 2021-10-09 MED ORDER — OXYCODONE-ACETAMINOPHEN 5-325 MG PO TABS
2.0000 | ORAL_TABLET | Freq: Once | ORAL | Status: AC
  Administered 2021-10-09: 2 via ORAL
  Filled 2021-10-09: qty 2

## 2021-10-09 NOTE — ED Triage Notes (Signed)
Patient c/o right groin pain that started about 1 week ago, states when pain hit him he felt like he needed to have a bowel movement. Reports pain is constant now.

## 2021-10-09 NOTE — ED Provider Notes (Signed)
[facility] EMERGENCY DEPARTMENT Provider Note   CSN: [id number] Arrival date & time: 10/09/21  2101     History {Add pertinent medical, surgical, social history, OB history to HPI:1} Chief Complaint  Patient presents with   Groin Pain    Louis Bush is a 46 y.o. adult.  46 yo M w/ right sided inguinal pain for the last week or so. Worse with some movements. Better with lying on stomach. No constipation, diarrhea, nausea, vomiitng or urinary symptoms. No h/o same. No trauma. No swelling. No h/o same.    Groin Pain       Home Medications Prior to Admission medications   Medication Sig Start Date End Date Taking? Authorizing Provider  albuterol (PROVENTIL HFA;VENTOLIN HFA) 108 (90 Base) MCG/ACT inhaler Inhale 1-2 puffs into the lungs every 6 (six) hours as needed for wheezing or shortness of breath. Patient not taking: Reported on 11/06/2017 04/06/16   Domenick Gong, MD  cyclobenzaprine (FLEXERIL) 10 MG tablet Take 1 tablet (10 mg total) by mouth 3 (three) times daily as needed for muscle spasms. Patient not taking: Reported on 11/06/2017 12/01/16   Lucia Estelle, NP  diazepam (VALIUM) 5 MG tablet Take 1 tablet (5 mg total) by mouth 2 (two) times daily. 12/05/17   Jacalyn Lefevre, MD  diclofenac (VOLTAREN) 75 MG EC tablet Take 1 tablet (75 mg total) by mouth 2 (two) times daily. Patient not taking: Reported on 11/06/2017 04/13/17   Mardella Layman, MD  doxycycline (VIBRAMYCIN) 100 MG capsule Take 1 capsule (100 mg total) by mouth 2 (two) times daily. X 10 days Patient not taking: Reported on 11/06/2017 04/06/16   Domenick Gong, MD  guaiFENesin-codeine [facility]) 100-10 MG/5ML syrup Take 10 mLs by mouth 4 (four) times daily as needed for cough. Patient not taking: Reported on 11/06/2017 04/06/16   Domenick Gong, MD  HYDROcodone-acetaminophen (NORCO/VICODIN) 5-325 MG tablet Take 1 tablet by mouth every 4 (four) hours as needed. 12/05/17   Jacalyn Lefevre, MD  ibuprofen  (ADVIL,MOTRIN) 800 MG tablet Take 1 tablet (800 mg total) by mouth 3 (three) times daily. Patient not taking: Reported on 11/06/2017 12/01/16   Lucia Estelle, NP  predniSONE (STERAPRED UNI-PAK 21 TAB) 10 MG (21) TBPK tablet Take 6 tabs for 2 days, then 5 for 2 days, then 4 for 2 days, then 3 for 2 days, 2 for 2 days, then 1 tab for 2 days 12/05/17   Jacalyn Lefevre, MD  Spacer/Aero-Holding Chambers (AEROCHAMBER PLUS) inhaler Use as instructed 04/06/16   Domenick Gong, MD      Allergies    Bee venom    Review of Systems   Review of Systems  Physical Exam Updated Vital Signs BP 123/90 (BP Location: Right Arm)   Pulse 100   Temp 98.4 F (36.9 C) (Oral)   Resp 20   Ht 6\' 7"  (2.007 m)   Wt (!) 154.4 kg   SpO2 99%   BMI 38.34 kg/m  Physical Exam  ED Results / Procedures / Treatments   Labs (all labs ordered are listed, but only abnormal results are displayed) Labs Reviewed - No data to display  EKG None  Radiology No results found.  Procedures Procedures  {Document cardiac monitor, telemetry assessment procedure when appropriate:1}  Medications Ordered in ED Medications  oxyCODONE-acetaminophen (PERCOCET/ROXICET) 5-325 MG per tablet 2 tablet (has no administration in time range)    ED Course/ Medical Decision Making/ A&P  Medical Decision Making Amount and/or Complexity of Data Reviewed Labs: ordered. Radiology: ordered.  Risk Prescription drug management.   ***  {Document critical care time when appropriate:1} {Document review of labs and clinical decision tools ie heart score, Chads2Vasc2 etc:1}  {Document your independent review of radiology images, and any outside records:1} {Document your discussion with family members, caretakers, and with consultants:1} {Document social determinants of health affecting pt's care:1} {Document your decision making why or why not admission, treatments were needed:1} Final Clinical Impression(s) /  ED Diagnoses Final diagnoses:  None    Rx / DC Orders ED Discharge Orders     None

## 2021-10-10 ENCOUNTER — Emergency Department (HOSPITAL_BASED_OUTPATIENT_CLINIC_OR_DEPARTMENT_OTHER): Payer: Self-pay

## 2021-10-10 LAB — COMPREHENSIVE METABOLIC PANEL
ALT: 25 U/L (ref 0–44)
AST: 26 U/L (ref 15–41)
Albumin: 3.9 g/dL (ref 3.5–5.0)
Alkaline Phosphatase: 87 U/L (ref 38–126)
Anion gap: 6 (ref 5–15)
BUN: 14 mg/dL (ref 6–20)
CO2: 26 mmol/L (ref 22–32)
Calcium: 9.2 mg/dL (ref 8.9–10.3)
Chloride: 107 mmol/L (ref 98–111)
Creatinine, Ser: 1.19 mg/dL (ref 0.61–1.24)
GFR, Estimated: >60 mL/min (ref >60)
Glucose, Bld: 97 mg/dL (ref 70–99)
Potassium: 3.7 mmol/L (ref 3.5–5.1)
Sodium: 139 mmol/L (ref 135–145)
Total Bilirubin: 0.5 mg/dL (ref 0.3–1.2)
Total Protein: 7.5 g/dL (ref 6.5–8.1)

## 2021-10-10 LAB — URINALYSIS, ROUTINE W REFLEX MICROSCOPIC
Bilirubin Urine: NEGATIVE
Glucose, UA: NEGATIVE mg/dL
Hgb urine dipstick: NEGATIVE
Ketones, ur: NEGATIVE mg/dL
Nitrite: NEGATIVE
Protein, ur: NEGATIVE mg/dL
Specific Gravity, Urine: 1.025 (ref 1.005–1.030)
pH: 5.5 (ref 5.0–8.0)

## 2021-10-10 LAB — URINALYSIS, MICROSCOPIC (REFLEX): RBC / HPF: NONE SEEN RBC/hpf (ref 0–5)

## 2021-10-10 MED ORDER — DICYCLOMINE HCL 10 MG PO CAPS
10.0000 mg | ORAL_CAPSULE | Freq: Once | ORAL | Status: AC
  Administered 2021-10-10: 10 mg via ORAL
  Filled 2021-10-10: qty 1

## 2021-10-10 MED ORDER — IOHEXOL 300 MG/ML  SOLN
125.0000 mL | Freq: Once | INTRAMUSCULAR | Status: AC | PRN
  Administered 2021-10-10: 125 mL via INTRAVENOUS

## 2021-10-10 MED ORDER — DICYCLOMINE HCL 20 MG PO TABS: 20.0000 mg | ORAL_TABLET | Freq: Two times a day (BID) | ORAL | 0 refills | Status: AC | PRN

## 2022-03-09 ENCOUNTER — Other Ambulatory Visit: Payer: Self-pay

## 2022-03-09 ENCOUNTER — Encounter (HOSPITAL_BASED_OUTPATIENT_CLINIC_OR_DEPARTMENT_OTHER): Payer: Self-pay

## 2022-03-09 ENCOUNTER — Emergency Department (HOSPITAL_BASED_OUTPATIENT_CLINIC_OR_DEPARTMENT_OTHER): Payer: Medicaid Other | Admitting: Radiology

## 2022-03-09 DIAGNOSIS — R0789 Other chest pain: Secondary | ICD-10-CM | POA: Diagnosis present

## 2022-03-09 NOTE — ED Triage Notes (Signed)
Pt reports midsternal chest pain x 2 days. Pt states that deep breathing and certain movements cause pain to worsen.

## 2022-03-10 ENCOUNTER — Emergency Department (HOSPITAL_BASED_OUTPATIENT_CLINIC_OR_DEPARTMENT_OTHER): Payer: Medicaid Other | Admitting: Radiology

## 2022-03-10 ENCOUNTER — Emergency Department (HOSPITAL_BASED_OUTPATIENT_CLINIC_OR_DEPARTMENT_OTHER)
Admission: EM | Admit: 2022-03-10 | Discharge: 2022-03-10 | Disposition: A | Discharge status: Home / Self Care | Payer: Medicaid Other | Attending: Emergency Medicine | Admitting: Emergency Medicine

## 2022-03-10 DIAGNOSIS — R0789 Other chest pain: Secondary | ICD-10-CM

## 2022-03-10 LAB — BASIC METABOLIC PANEL WITH GFR
Anion gap: 8 (ref 5–15)
BUN: 15 mg/dL (ref 6–20)
CO2: 27 mmol/L (ref 22–32)
Calcium: 9.5 mg/dL (ref 8.9–10.3)
Chloride: 104 mmol/L (ref 98–111)
Creatinine, Ser: 1.32 mg/dL — ABNORMAL HIGH (ref 0.61–1.24)
GFR, Estimated: >60 mL/min
Glucose, Bld: 96 mg/dL (ref 70–99)
Potassium: 3.7 mmol/L (ref 3.5–5.1)
Sodium: 139 mmol/L (ref 135–145)

## 2022-03-10 LAB — CBC
HCT: 40.3 % (ref 39.0–52.0)
Hemoglobin: 13.4 g/dL (ref 13.0–17.0)
MCH: 27.3 pg (ref 26.0–34.0)
MCHC: 33.3 g/dL (ref 30.0–36.0)
MCV: 82.2 fL (ref 80.0–100.0)
Platelets: 255 10*3/uL (ref 150–400)
RBC: 4.9 MIL/uL (ref 4.22–5.81)
RDW: 16.6 % — ABNORMAL HIGH (ref 11.5–15.5)
WBC: 8.5 10*3/uL (ref 4.0–10.5)
nRBC: 0 % (ref 0.0–0.2)

## 2022-03-10 LAB — TROPONIN I (HIGH SENSITIVITY)
Troponin I (High Sensitivity): <2 ng/L (ref <18)
Troponin I (High Sensitivity): <2 ng/L

## 2022-03-10 MED ORDER — KETOROLAC TROMETHAMINE 30 MG/ML IJ SOLN
30.0000 mg | Freq: Once | INTRAMUSCULAR | Status: AC
  Administered 2022-03-10: 30 mg via INTRAMUSCULAR
  Filled 2022-03-10: qty 1

## 2022-03-10 NOTE — ED Provider Notes (Signed)
[facility] EMERGENCY DEPT Provider Note   CSN: [id number] Arrival date & time: 03/09/22  2311     History  Chief Complaint  Patient presents with   Chest Pain    Louis Bush is a 46 y.o. male.  HPI     This is a 46 year old male with no reported past medical history who presents with chest pain.  Patient reports 2 to 3-day history of worsening anterior chest pain.  It is worse with deep breathing and with any movements.  Denies heavy lifting or injury.  Denies history of blood clots.  Denies recent upper respiratory symptoms including cough or fevers.  No exertional symptoms.  Pain does not radiate.  Denies history of high blood pressure, high cholesterol, diabetes.  Home Medications Prior to Admission medications   Medication Sig Start Date End Date Taking? Authorizing Provider  albuterol (PROVENTIL HFA;VENTOLIN HFA) 108 (90 Base) MCG/ACT inhaler Inhale 1-2 puffs into the lungs every 6 (six) hours as needed for wheezing or shortness of breath. Patient not taking: Reported on 11/06/2017 04/06/16   Domenick Gong, MD  cyclobenzaprine (FLEXERIL) 10 MG tablet Take 1 tablet (10 mg total) by mouth 3 (three) times daily as needed for muscle spasms. Patient not taking: Reported on 11/06/2017 12/01/16   Lucia Estelle, NP  diazepam (VALIUM) 5 MG tablet Take 1 tablet (5 mg total) by mouth 2 (two) times daily. 12/05/17   Jacalyn Lefevre, MD  diclofenac (VOLTAREN) 75 MG EC tablet Take 1 tablet (75 mg total) by mouth 2 (two) times daily. Patient not taking: Reported on 11/06/2017 04/13/17   Mardella Layman, MD  dicyclomine (BENTYL) 20 MG tablet Take 1 tablet (20 mg total) by mouth 2 (two) times daily as needed for spasms (abdominal cramping). 10/10/21   Mesner, Barbara Cower, MD  doxycycline (VIBRAMYCIN) 100 MG capsule Take 1 capsule (100 mg total) by mouth 2 (two) times daily. X 10 days Patient not taking: Reported on 11/06/2017 04/06/16   Domenick Gong, MD  guaiFENesin-codeine [facility]) 100-10 MG/5ML syrup Take 10 mLs by mouth 4 (four) times daily as needed for cough. Patient not taking: Reported on 11/06/2017 04/06/16   Domenick Gong, MD  HYDROcodone-acetaminophen (NORCO/VICODIN) 5-325 MG tablet Take 1 tablet by mouth every 4 (four) hours as needed. 12/05/17   Jacalyn Lefevre, MD  ibuprofen (ADVIL,MOTRIN) 800 MG tablet Take 1 tablet (800 mg total) by mouth 3 (three) times daily. Patient not taking: Reported on 11/06/2017 12/01/16   Lucia Estelle, NP  predniSONE (STERAPRED UNI-PAK 21 TAB) 10 MG (21) TBPK tablet Take 6 tabs for 2 days, then 5 for 2 days, then 4 for 2 days, then 3 for 2 days, 2 for 2 days, then 1 tab for 2 days 12/05/17   Jacalyn Lefevre, MD  Spacer/Aero-Holding Chambers (AEROCHAMBER PLUS) inhaler Use as instructed 04/06/16   Domenick Gong, MD      Allergies    Bee venom    Review of Systems   Review of Systems  Cardiovascular:  Positive for chest pain.  All other systems reviewed and are negative.   Physical Exam Updated Vital Signs BP 120/68   Pulse (!) 51   Temp 98.2 F (36.8 C) (Oral)   Resp 15   Ht 1.981 m (6\' 6" )   Wt (!) 149.7 kg   SpO2 97%   BMI 38.14 kg/m  Physical Exam Vitals and nursing note reviewed.  Constitutional:      Appearance: He is well-developed. He is obese. He is not ill-appearing.  HENT:     Head: Normocephalic and atraumatic.  Eyes:     Pupils: Pupils are equal, round, and reactive to light.  Cardiovascular:     Rate and Rhythm: Normal rate and regular rhythm.     Heart sounds: Normal heart sounds. No murmur heard. Pulmonary:     Effort: Pulmonary effort is normal. No respiratory distress.     Breath sounds: Normal breath sounds. No wheezing.  Chest:     Chest wall: Tenderness present.  Abdominal:     General: Bowel sounds are normal.     Palpations: Abdomen is soft.     Tenderness: There is no abdominal tenderness. There is no rebound.  Musculoskeletal:     Cervical back: Neck supple.  Lymphadenopathy:      Cervical: No cervical adenopathy.  Skin:    General: Skin is warm and dry.  Neurological:     General: No focal deficit present.     Mental Status: He is alert and oriented to person, place, and time.     ED Results / Procedures / Treatments   Labs (all labs ordered are listed, but only abnormal results are displayed) Labs Reviewed  BASIC METABOLIC PANEL - Abnormal; Notable for the following components:      Result Value   Creatinine, Ser 1.32 (*)    All other components within normal limits  CBC - Abnormal; Notable for the following components:   RDW 16.6 (*)    All other components within normal limits  TROPONIN I (HIGH SENSITIVITY)  TROPONIN I (HIGH SENSITIVITY)    EKG EKG Interpretation  Date/Time:  Friday March 09 2022 23:27:56 EST Ventricular Rate:  61 PR Interval:  186 QRS Duration: 96 QT Interval:  416 QTC Calculation: 418 R Axis:   -41 Text Interpretation: Normal sinus rhythm Left axis deviation Abnormal ECG When compared with ECG of 05-Nov-2017 21:37, QRS axis Shifted left Confirmed by Thayer Jew [phone]) on 03/10/2022 3:12:44 AM  Radiology DG Chest 2 View  Result Date: 03/10/2022 CLINICAL DATA:  Chest pain for 2 days EXAM: CHEST - 2 VIEW COMPARISON:  Radiographs 11/05/2017 FINDINGS: The heart size and mediastinal contours are within normal limits. Both lungs are clear. The visualized skeletal structures are unremarkable. IMPRESSION: No active cardiopulmonary disease. Electronically Signed   By: Placido Sou M.D.   On: 03/10/2022 00:47    Procedures Procedures    Medications Ordered in ED Medications  ketorolac (TORADOL) 30 MG/ML injection 30 mg (has no administration in time range)    ED Course/ Medical Decision Making/ A&P                           Medical Decision Making Amount and/or Complexity of Data Reviewed Labs: ordered. Radiology: ordered.  Risk Prescription drug management.   This patient presents to the ED for concern  of chest pain, this involves an extensive number of treatment options, and is a complaint that carries with it a high risk of complications and morbidity.  I considered the following differential and admission for this acute, potentially life threatening condition.  The differential diagnosis includes ACS, PE, pneumothorax, pneumonia, musculoskeletal pain  MDM:    This is a 46 year old male who presents with chest pain.  He is nontoxic and vital signs are reassuring.  He has reproducible tenderness on exam.  Pain is highly atypical for ACS.  EKG without acute ischemic arrhythmic changes.  Troponin x 2 negative.  Doubt ACS.  He is PERC negative.  Doubt PE.  Chest x-ray without pneumothorax or pneumonia.  Overall workup reassuring.  Patient was given Toradol.  Recommend ibuprofen for likely chest wall pain.  (Labs, imaging, consults)  Labs: I Ordered, and personally interpreted labs.  The pertinent results include: CBC, BMP, troponin x 2  Imaging Studies ordered: I ordered imaging studies including chest x-ray I independently visualized and interpreted imaging. I agree with the radiologist interpretation  Additional history obtained from wife at bedside.  External records from outside source obtained and reviewed including prior evaluations  Cardiac Monitoring: The patient was maintained on a cardiac monitor.  I personally viewed and interpreted the cardiac monitored which showed an underlying rhythm of: Sinus rhythm  Reevaluation: After the interventions noted above, I reevaluated the patient and found that they have :improved  Social Determinants of Health:  lives independently  Disposition: Discharge  Co morbidities that complicate the patient evaluation History reviewed. No pertinent past medical history.   Medicines Meds ordered this encounter  Medications   ketorolac (TORADOL) 30 MG/ML injection 30 mg    I have reviewed the patients home medicines and have made adjustments as  needed  Problem List / ED Course: Problem List Items Addressed This Visit   None Visit Diagnoses     Atypical chest pain    -  Primary                   Final Clinical Impression(s) / ED Diagnoses Final diagnoses:  Atypical chest pain    Rx / DC Orders ED Discharge Orders     None         Merryl Hacker, MD 03/10/22 [phone]

## 2022-03-10 NOTE — Discharge Instructions (Signed)
You were seen today for chest pain.  Your workup today is reassuring including heart testing.  Take ibuprofen as needed for pain.

## 2022-07-31 ENCOUNTER — Ambulatory Visit: Payer: Medicaid Other | Admitting: Podiatry

## 2022-08-31 ENCOUNTER — Ambulatory Visit: Payer: Medicaid Other | Admitting: Podiatry

## 2022-08-31 DIAGNOSIS — L600 Ingrowing nail: Secondary | ICD-10-CM | POA: Diagnosis not present

## 2022-08-31 DIAGNOSIS — B351 Tinea unguium: Secondary | ICD-10-CM

## 2022-08-31 DIAGNOSIS — Z79899 Other long term (current) drug therapy: Secondary | ICD-10-CM | POA: Diagnosis not present

## 2022-08-31 DIAGNOSIS — B353 Tinea pedis: Secondary | ICD-10-CM | POA: Diagnosis not present

## 2022-08-31 NOTE — Progress Notes (Signed)
Subjective:   Patient ID: Louis Bush, male   DOB: 47 y.o.   MRN: [id number]   HPI Chief Complaint  Patient presents with  . Nail Problem    Rm 12 Bilateral nail thickness and discoloration. No treatments tried.   . Ingrown Toenail    Left great hallux bilateral borders and left 2nd nail soreness. No drainage or bleeding.     No treatment.   Possible cyst left 2nd toe  ROS      Objective:  Physical Exam  ***     Assessment:  *** Corn left 5th     Plan:  ***    R med Left m/l We discussed using Lamisil for onychomycosis. This drug offers a fairly high but not universal cure rate. A 12 week treatment course is recommended. The patient is aware that rare cases of liver injury have been reported; and agrees to come in for liver function tests at 6 weeks of treatment. The symptoms of liver disease have been discussed; call if such occurs. In addition, some insurance plans do not cover the expense of this drug for treating a cosmetic condition, and the patient understands they may have to pay for the medication. Other side effects, such as headaches and rashes, have also been discussed.

## 2022-08-31 NOTE — Patient Instructions (Addendum)
Place 1/4 cup of epsom salts in a quart of warm tap water.  Submerge your foot or feet in the solution and soak for 20 minutes.  This soak should be done twice a day.  Next, remove your foot or feet from solution, blot dry the affected area. Apply ointment and cover if instructed by your doctor.   IF YOUR SKIN BECOMES IRRITATED WHILE USING THESE INSTRUCTIONS, IT IS OKAY TO SWITCH TO  WHITE VINEGAR AND WATER.  As another alternative soak, you may use antibacterial soap and water.  Monitor for any signs/symptoms of infection. Call the office immediately if any occur or go directly to the emergency room. Call with any questions/concerns.   ---   Terbinafine Tablets What is this medication? TERBINAFINE (TER bin a feen) treats fungal infections of the nails. It belongs to a group of medications called antifungals. It will not treat infections caused by bacteria or viruses. This medicine may be used for other purposes; ask your health care provider or pharmacist if you have questions. COMMON BRAND NAME(S): Lamisil, Terbinex What should I tell my care team before I take this medication? They need to know if you have any of these conditions: Liver disease An unusual or allergic reaction to terbinafine, other medications, foods, dyes, or preservatives Pregnant or trying to get pregnant Breast-feeding How should I use this medication? Take this medication by mouth with water. Take it as directed on the prescription label at the same time every day. You can take it with or without food. If it upsets your stomach, take it with food. Keep taking it unless your care team tells you to stop. A special MedGuide will be given to you by the pharmacist with each prescription and refill. Be sure to read this information carefully each time. Talk to your care team regarding the use of this medication in children. Special care may be needed. Overdosage: If you think you have taken too much of this medicine contact  a poison control center or emergency room at once. NOTE: This medicine is only for you. Do not share this medicine with others. What if I miss a dose? If you miss a dose, take it as soon as you can unless it is more than 4 hours late. If it is more than 4 hours late, skip the missed dose. Take the next dose at the normal time. What may interact with this medication? Do not take this medication with any of the following: Pimozide Thioridazine This medication may also interact with the following: Beta blockers Caffeine Certain medications for mental health conditions Cimetidine Cyclosporine Medications for fungal infections like fluconazole and ketoconazole Medications for irregular heartbeat like amiodarone, flecainide and propafenone Rifampin Warfarin This list may not describe all possible interactions. Give your health care provider a list of all the medicines, herbs, non-prescription drugs, or dietary supplements you use. Also tell them if you smoke, drink alcohol, or use illegal drugs. Some items may interact with your medicine. What should I watch for while using this medication? Visit your care team for regular checks on your progress. You may need blood work while you are taking this medication. It may be some time before you see the benefit from this medication. This medication may cause serious skin reactions. They can happen weeks to months after starting the medication. Contact your care team right away if you notice fevers or flu-like symptoms with a rash. The rash may be red or purple and then turn into blisters or  peeling of the skin. Or, you might notice a red rash with swelling of the face, lips or lymph nodes in your neck or under your arms. This medication can make you more sensitive to the sun. Keep out of the sun, If you cannot avoid being in the sun, wear protective clothing and sunscreen. Do not use sun lamps or tanning beds/booths. What side effects may I notice from  receiving this medication? Side effects that you should report to your care team as soon as possible: Allergic reactions--skin rash, itching, hives, swelling of the face, lips, tongue, or throat Change in sense of smell Change in taste Infection--fever, chills, cough, or sore throat Liver injury--right upper belly pain, loss of appetite, nausea, light-colored stool, dark yellow or Pries urine, yellowing skin or eyes, unusual weakness or fatigue Low red blood cell level--unusual weakness or fatigue, dizziness, headache, trouble breathing Lupus-like syndrome--joint pain, swelling, or stiffness, butterfly-shaped rash on the face, rashes that get worse in the sun, fever, unusual weakness or fatigue Rash, fever, and swollen lymph nodes Redness, blistering, peeling, or loosening of the skin, including inside the mouth Unusual bruising or bleeding Worsening mood, feelings of depression Side effects that usually do not require medical attention (report to your care team if they continue or are bothersome): Diarrhea Gas Headache Nausea Stomach pain Upset stomach This list may not describe all possible side effects. Call your doctor for medical advice about side effects. You may report side effects to FDA at 1-800-FDA-1088. Where should I keep my medication? Keep out of the reach of children and pets. Store between 20 and 25 degrees C (68 and 77 degrees F). Protect from light. Get rid of any unused medication after the expiration date. To get rid of medications that are no longer needed or have expired: Take the medication to a medication take-back program. Check with your pharmacy or law enforcement to find a location. If you cannot return the medication, check the label or package insert to see if the medication should be thrown out in the garbage or flushed down the toilet. If you are not sure, ask your care team. If it is safe to put it in the trash, take the medication out of the container. Mix  the medication with cat litter, dirt, coffee grounds, or other unwanted substance. Seal the mixture in a bag or container. Put it in the trash. NOTE: This sheet is a summary. It may not cover all possible information. If you have questions about this medicine, talk to your doctor, pharmacist, or health care provider.  2024 Elsevier/Gold Standard (2020-10-26 00:00:00)

## 2024-03-12 ENCOUNTER — Ambulatory Visit (INDEPENDENT_AMBULATORY_CARE_PROVIDER_SITE_OTHER)

## 2024-03-12 ENCOUNTER — Encounter: Payer: Self-pay | Admitting: Podiatry

## 2024-03-12 ENCOUNTER — Ambulatory Visit: Admitting: Podiatry

## 2024-03-12 DIAGNOSIS — M21619 Bunion of unspecified foot: Secondary | ICD-10-CM

## 2024-03-12 DIAGNOSIS — M2041 Other hammer toe(s) (acquired), right foot: Secondary | ICD-10-CM

## 2024-03-12 DIAGNOSIS — M21611 Bunion of right foot: Secondary | ICD-10-CM

## 2024-03-12 NOTE — Progress Notes (Signed)
 Subjective:   Patient ID: Louis Bush, male   DOB: 48 y.o.   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  At this point debrided the lesion will be seen back as needed and if it gets painful again quickly will require surgical correction which I made him aware of today and educated him on deformity

## 2024-03-25 ENCOUNTER — Emergency Department (HOSPITAL_COMMUNITY)

## 2024-03-25 ENCOUNTER — Emergency Department (HOSPITAL_COMMUNITY)
Admission: EM | Admit: 2024-03-25 | Discharge: 2024-03-25 | Disposition: A | Discharge status: Home / Self Care | Source: Home / Self Care | Attending: Emergency Medicine | Admitting: Emergency Medicine

## 2024-03-25 ENCOUNTER — Other Ambulatory Visit: Payer: Self-pay

## 2024-03-25 DIAGNOSIS — M545 Low back pain, unspecified: Secondary | ICD-10-CM | POA: Insufficient documentation

## 2024-03-25 DIAGNOSIS — M25511 Pain in right shoulder: Secondary | ICD-10-CM | POA: Diagnosis not present

## 2024-03-25 DIAGNOSIS — Z79899 Other long term (current) drug therapy: Secondary | ICD-10-CM | POA: Insufficient documentation

## 2024-03-25 DIAGNOSIS — Y92410 Unspecified street and highway as the place of occurrence of the external cause: Secondary | ICD-10-CM | POA: Diagnosis not present

## 2024-03-25 DIAGNOSIS — S0990XA Unspecified injury of head, initial encounter: Secondary | ICD-10-CM | POA: Insufficient documentation

## 2024-03-25 MED ORDER — CYCLOBENZAPRINE HCL 10 MG PO TABS: 10.0000 mg | ORAL_TABLET | Freq: Two times a day (BID) | ORAL | 0 refills | Status: AC | PRN

## 2024-03-25 MED ORDER — ACETAMINOPHEN ER 650 MG PO TBCR: 650.0000 mg | EXTENDED_RELEASE_TABLET | Freq: Three times a day (TID) | ORAL | 0 refills | Status: AC | PRN

## 2024-03-25 MED ORDER — ACETAMINOPHEN 325 MG PO TABS
650.0000 mg | ORAL_TABLET | ORAL | Status: AC
  Administered 2024-03-25: 650 mg via ORAL
  Filled 2024-03-25: qty 2
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
## 2024-03-25 NOTE — Progress Notes (Signed)
 Orthopedic Tech Progress Note Patient Details:  Louis Bush 1975/08/14 [id number] Applied sling immobilizer per order.  Ortho Devices Type of Ortho Device: Sling immobilizer Ortho Device/Splint Location: RUE Ortho Device/Splint Interventions: Ordered, Adjustment, Application   Post Interventions Patient Tolerated: Well Instructions Provided: Adjustment of device, Care of device, Poper ambulation with device  [name] 03/25/2024, 2:51 AM

## 2024-03-25 NOTE — Discharge Instructions (Signed)
 As we discussed, your imaging here tonight is unremarkable and negative for any kind of acute injury.  Please follow-up with PCP for reevaluation.  Take Tylenol  every 6 hours as needed for body aches.  Take Flexeril  twice a day.  Flexeril  is a muscle relaxer so do not drive or operate machinery while taking this.  Follow-up with your PCP.  Return to ED with new symptoms.
